# Patient Record
Sex: Female | Born: 1985 | Race: White | Hispanic: No | Marital: Married | Smoking: Current every day smoker
Health system: Southern US, Community
[De-identification: ages and names within clinical notes are randomized; demographics above are authoritative.]

---
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
## 2006-05-18 ENCOUNTER — Emergency Department: Payer: Self-pay | Admitting: Emergency Medicine

## 2006-07-31 ENCOUNTER — Other Ambulatory Visit: Payer: Self-pay

## 2006-07-31 ENCOUNTER — Emergency Department: Payer: Self-pay

## 2006-10-19 IMAGING — CR DG CHEST 1V PORT
1 series · 1 of 1 positions shown · non-contrast
Comparison: none

REASON FOR EXAM: Low O2 saturation
COMMENTS:  LMP: N/A

[view not recorded]
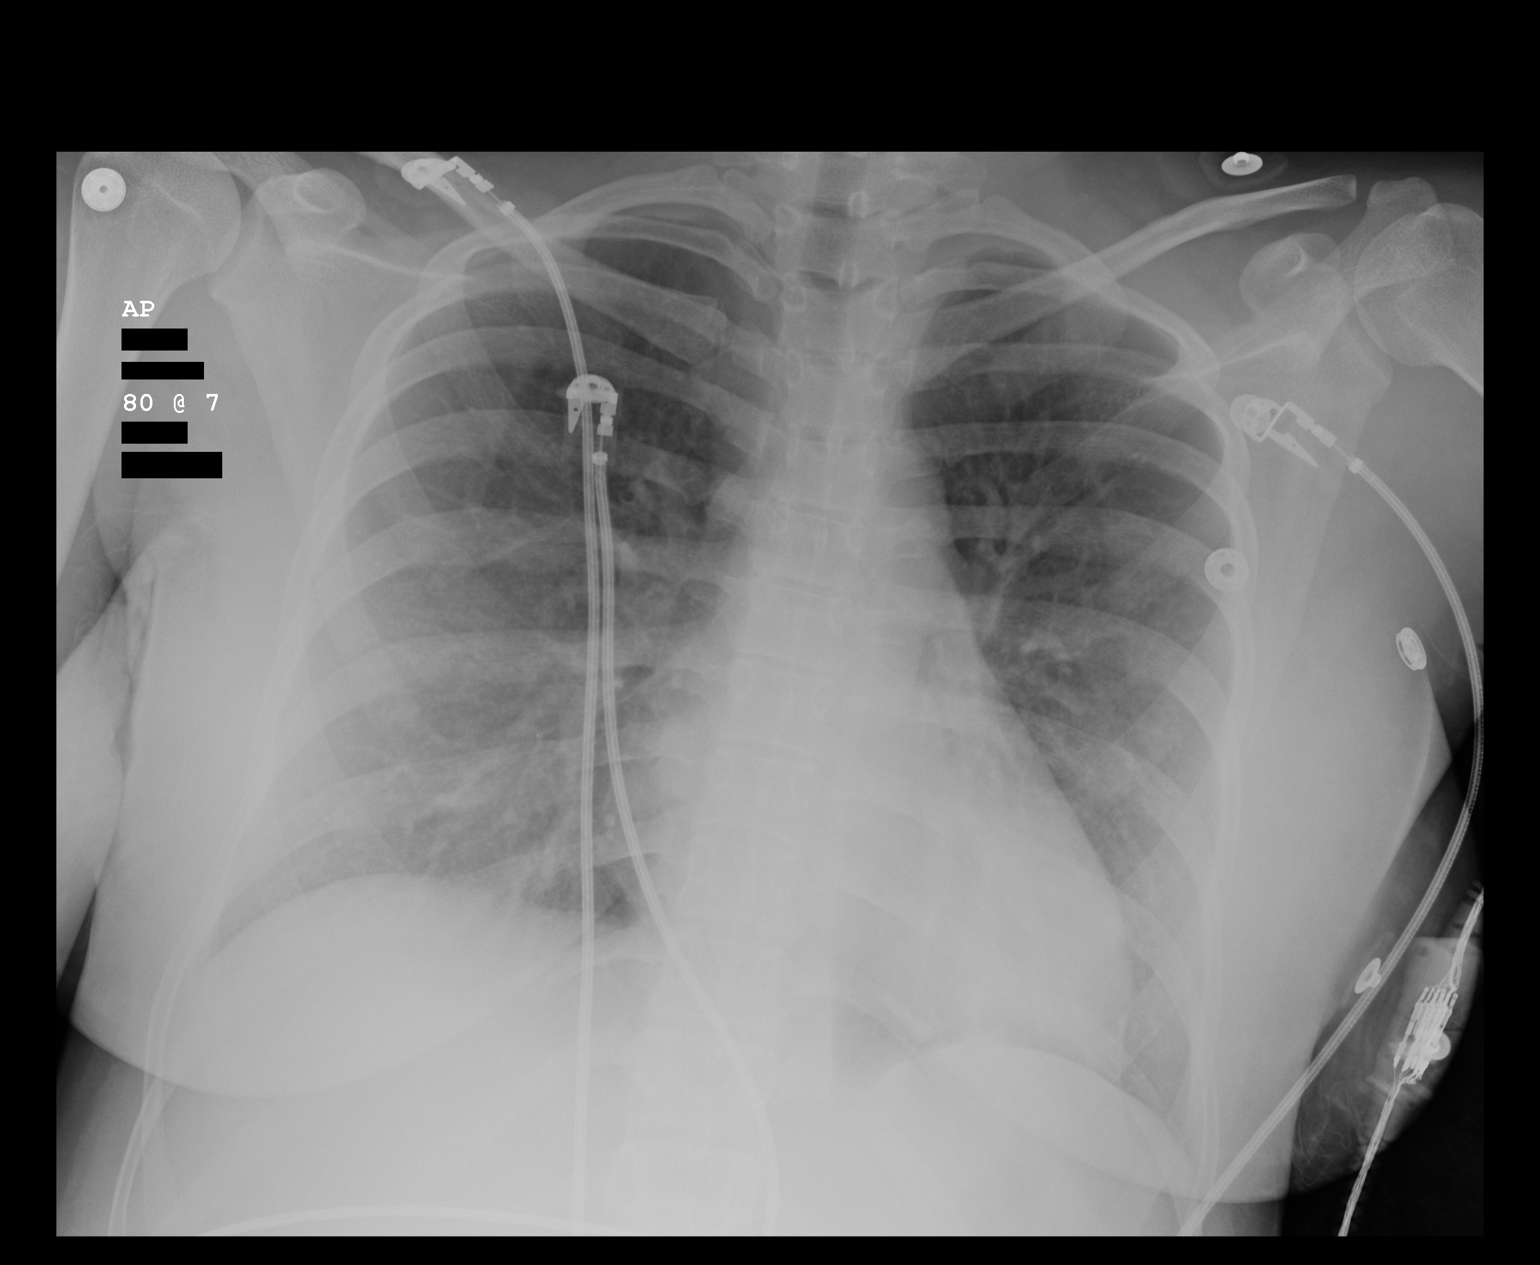

[1 of 1 positions shown; findings below may reference images not displayed]

PROCEDURE:     DXR - DXR PORTABLE CHEST SINGLE VIEW  - July 08, 2005  [DATE]

RESULT:     There are no prior studies available for comparison.

The exam demonstrates pulmonary vascular congestion which can be consistent
with the patient's gestational status. No overt pulmonary edema or
congestive failure is seen. No focal infiltrate or pneumothorax is evident.
The heart is not enlarged.
IMPRESSION: No significant consolidation, effusion or pneumothorax.
Slight pulmonary vascular congestion which is consistent with the patient's
gestational status.

## 2007-10-13 ENCOUNTER — Emergency Department: Payer: Self-pay | Admitting: Emergency Medicine

## 2007-10-27 ENCOUNTER — Emergency Department: Payer: Self-pay | Admitting: Emergency Medicine

## 2007-11-11 IMAGING — CR DG CHEST 2V
1 series · 2 of 2 positions shown · non-contrast
Comparison: none

REASON FOR EXAM: Chest pain        rm 19
COMMENTS:

PROCEDURE:     DXR - DXR CHEST PA (OR AP) AND LATERAL  - July 31, 2006 [DATE]
RESULT:        Comparison is made to prior study dated 05/18/06.
The lungs are clear.  The cardiac silhouette and visualized bony skeleton
are unremarkable.

[Series 1: view not recorded · 0.17mm/px · 2 of 2 slices shown]
[im 1/2]
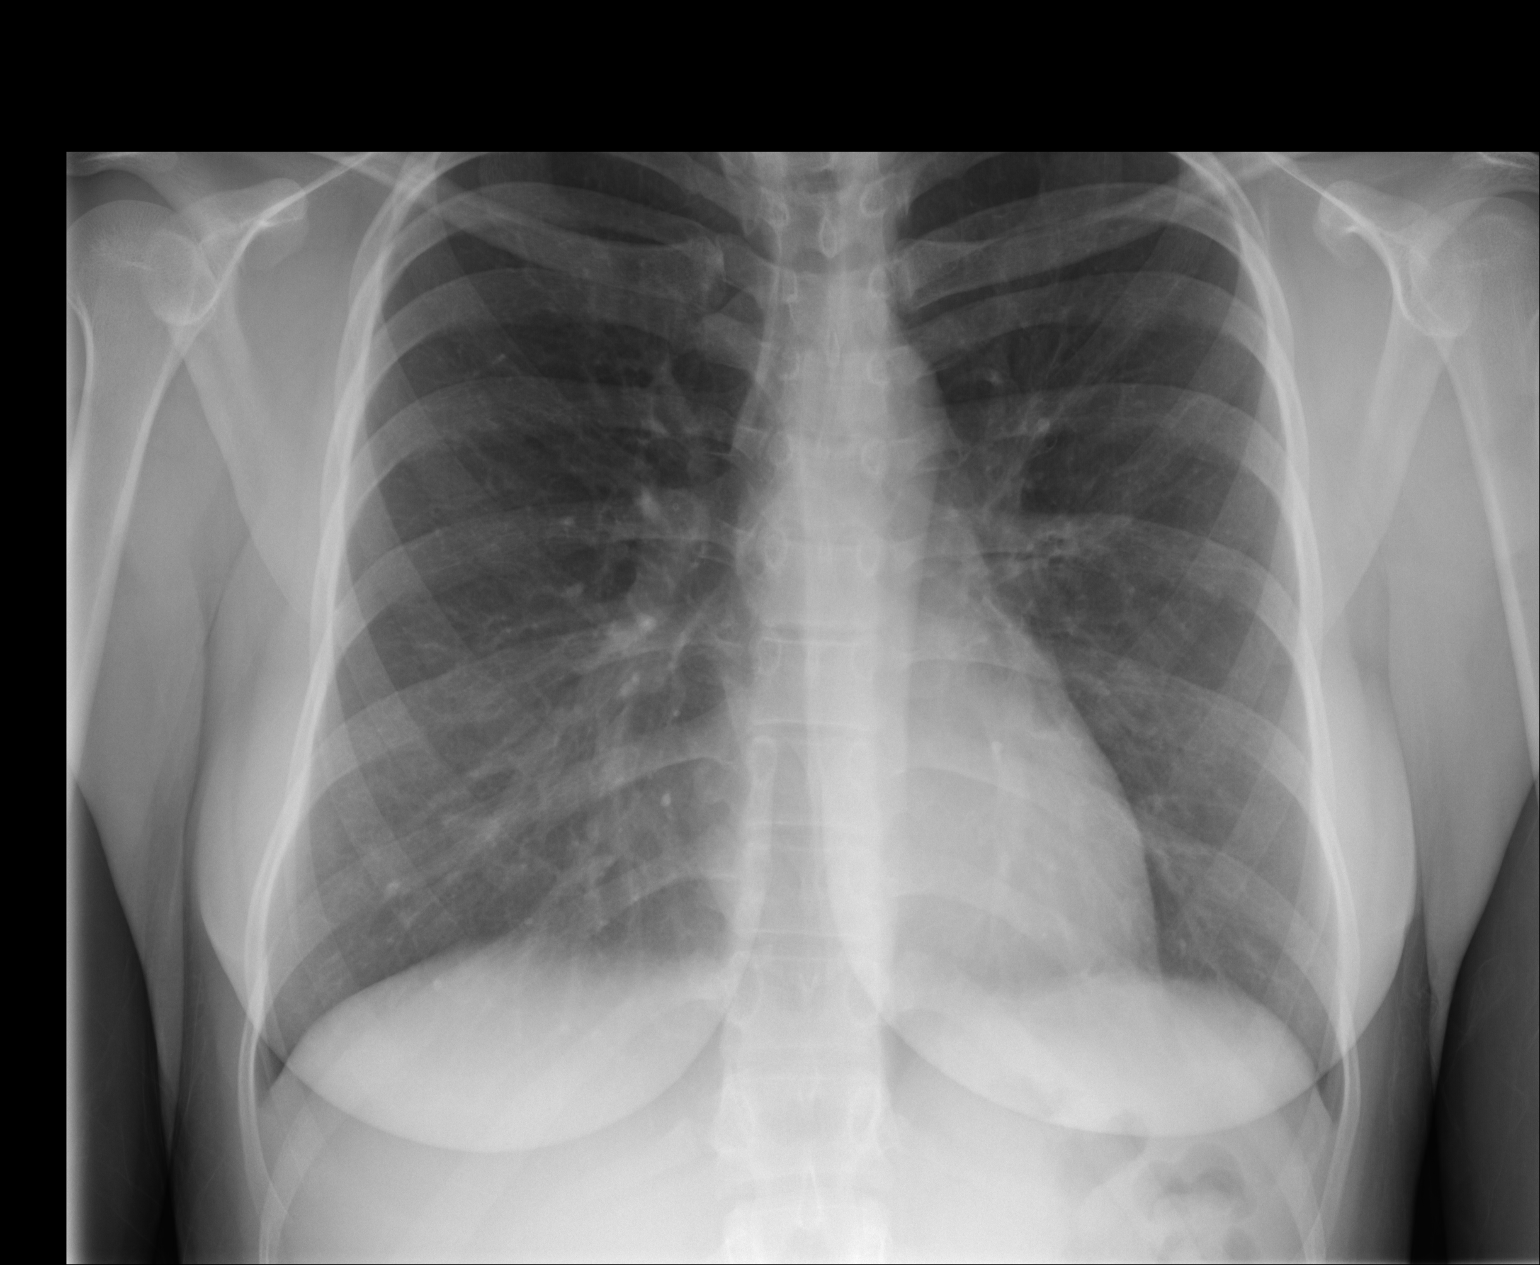
[im 2/2]
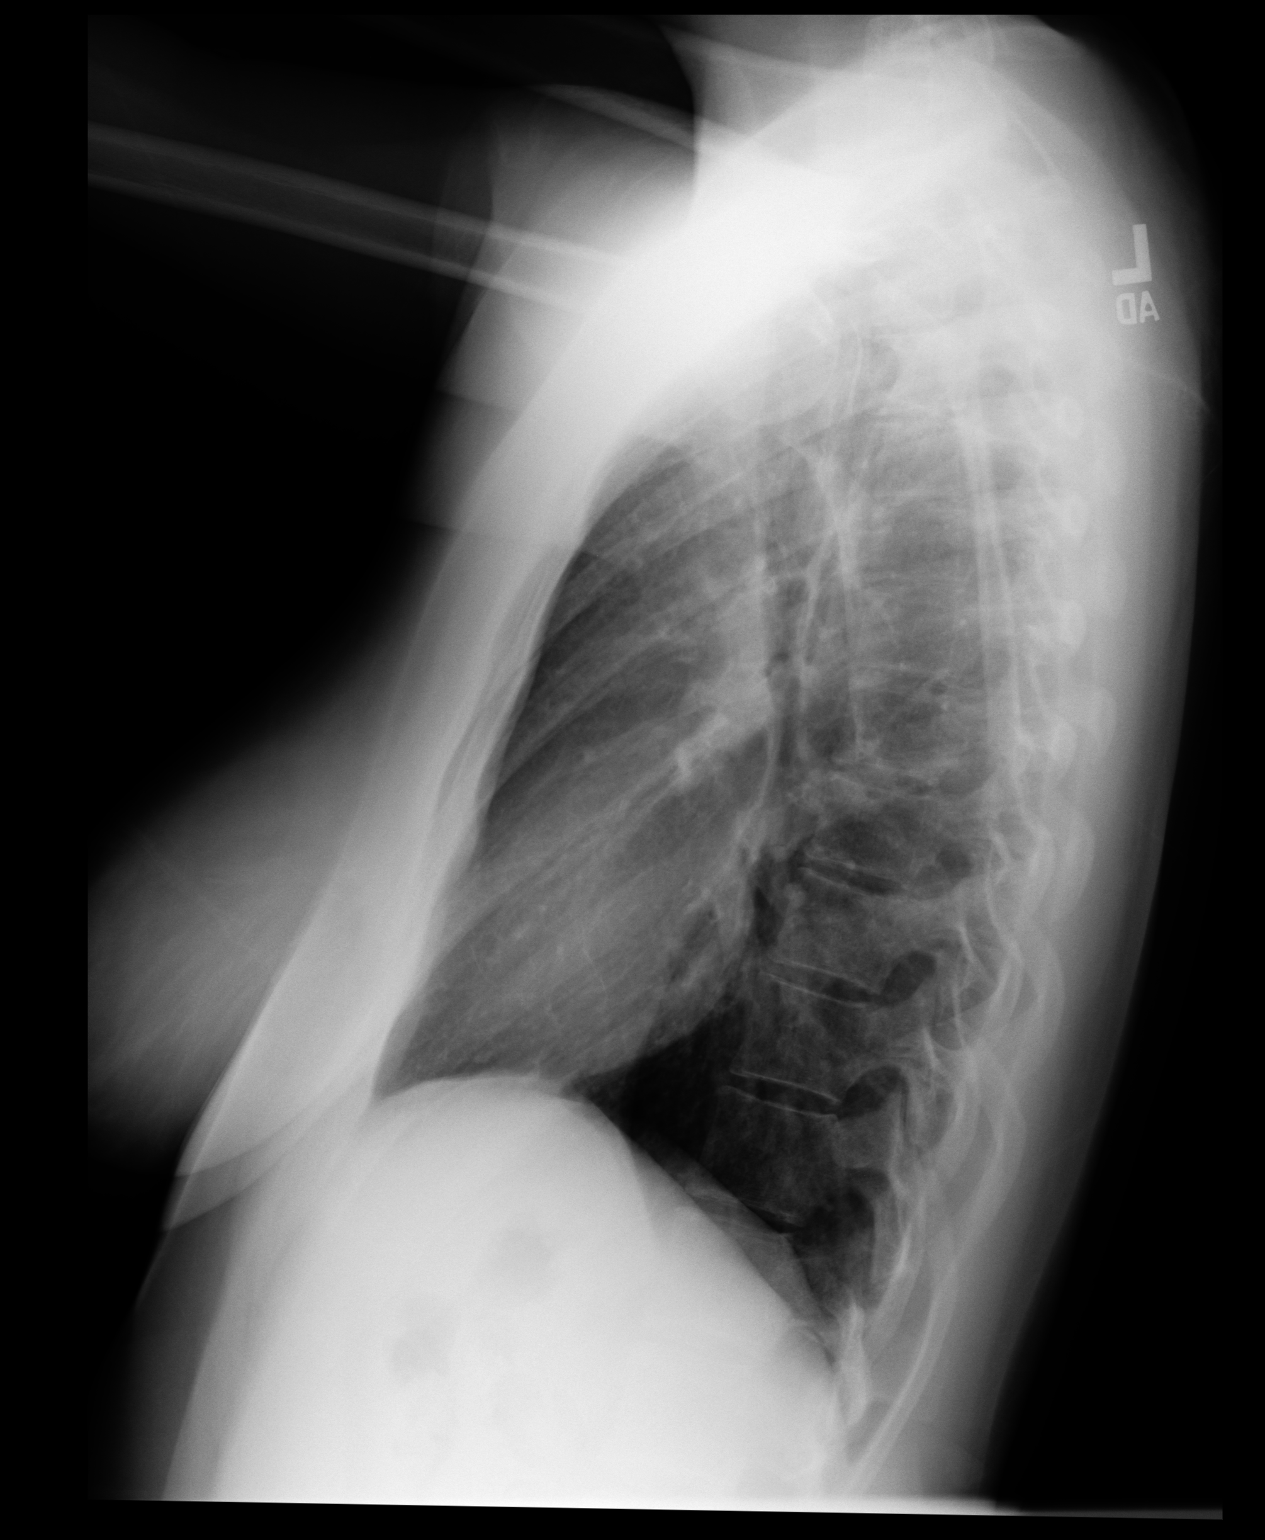

[2 of 2 positions shown; findings below may reference images not displayed]

IMPRESSION: Chest radiograph without evidence of acute cardiopulmonary
disease.

## 2008-03-17 ENCOUNTER — Other Ambulatory Visit: Payer: Self-pay

## 2008-03-17 ENCOUNTER — Emergency Department: Payer: Self-pay | Admitting: Emergency Medicine

## 2009-02-06 IMAGING — CR DG CHEST 2V
1 series · 2 of 2 positions shown · non-contrast
Comparison: none

REASON FOR EXAM: cough
COMMENTS:

PROCEDURE:     DXR - DXR CHEST PA (OR AP) AND LATERAL  - October 27, 2007  [DATE]
RESULT:     The lungs are mildly hyperinflated. There is no focal
infiltrate. The perihilar lung markings are minimally prominent but not
significantly changed from 31 July, 2006.

[Series 1: view not recorded · 0.17mm/px · 2 of 2 slices shown]
[im 1/2]
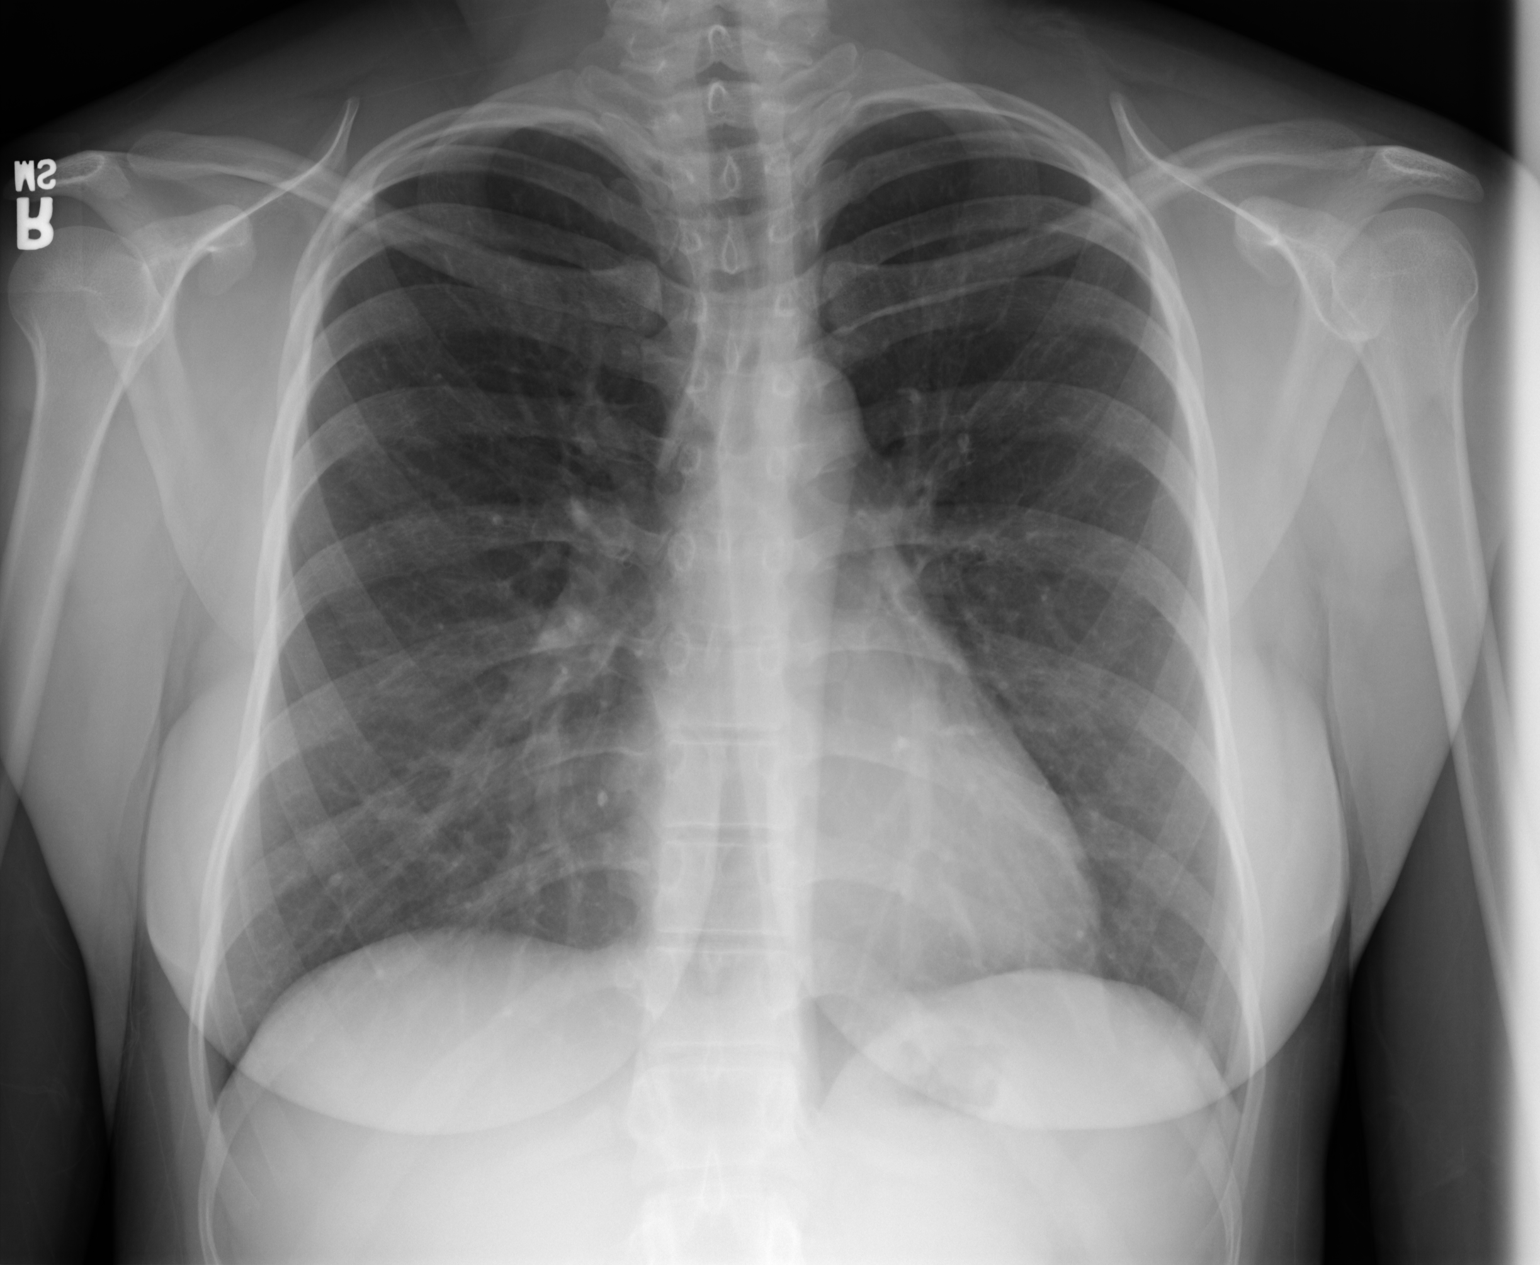
[im 2/2]
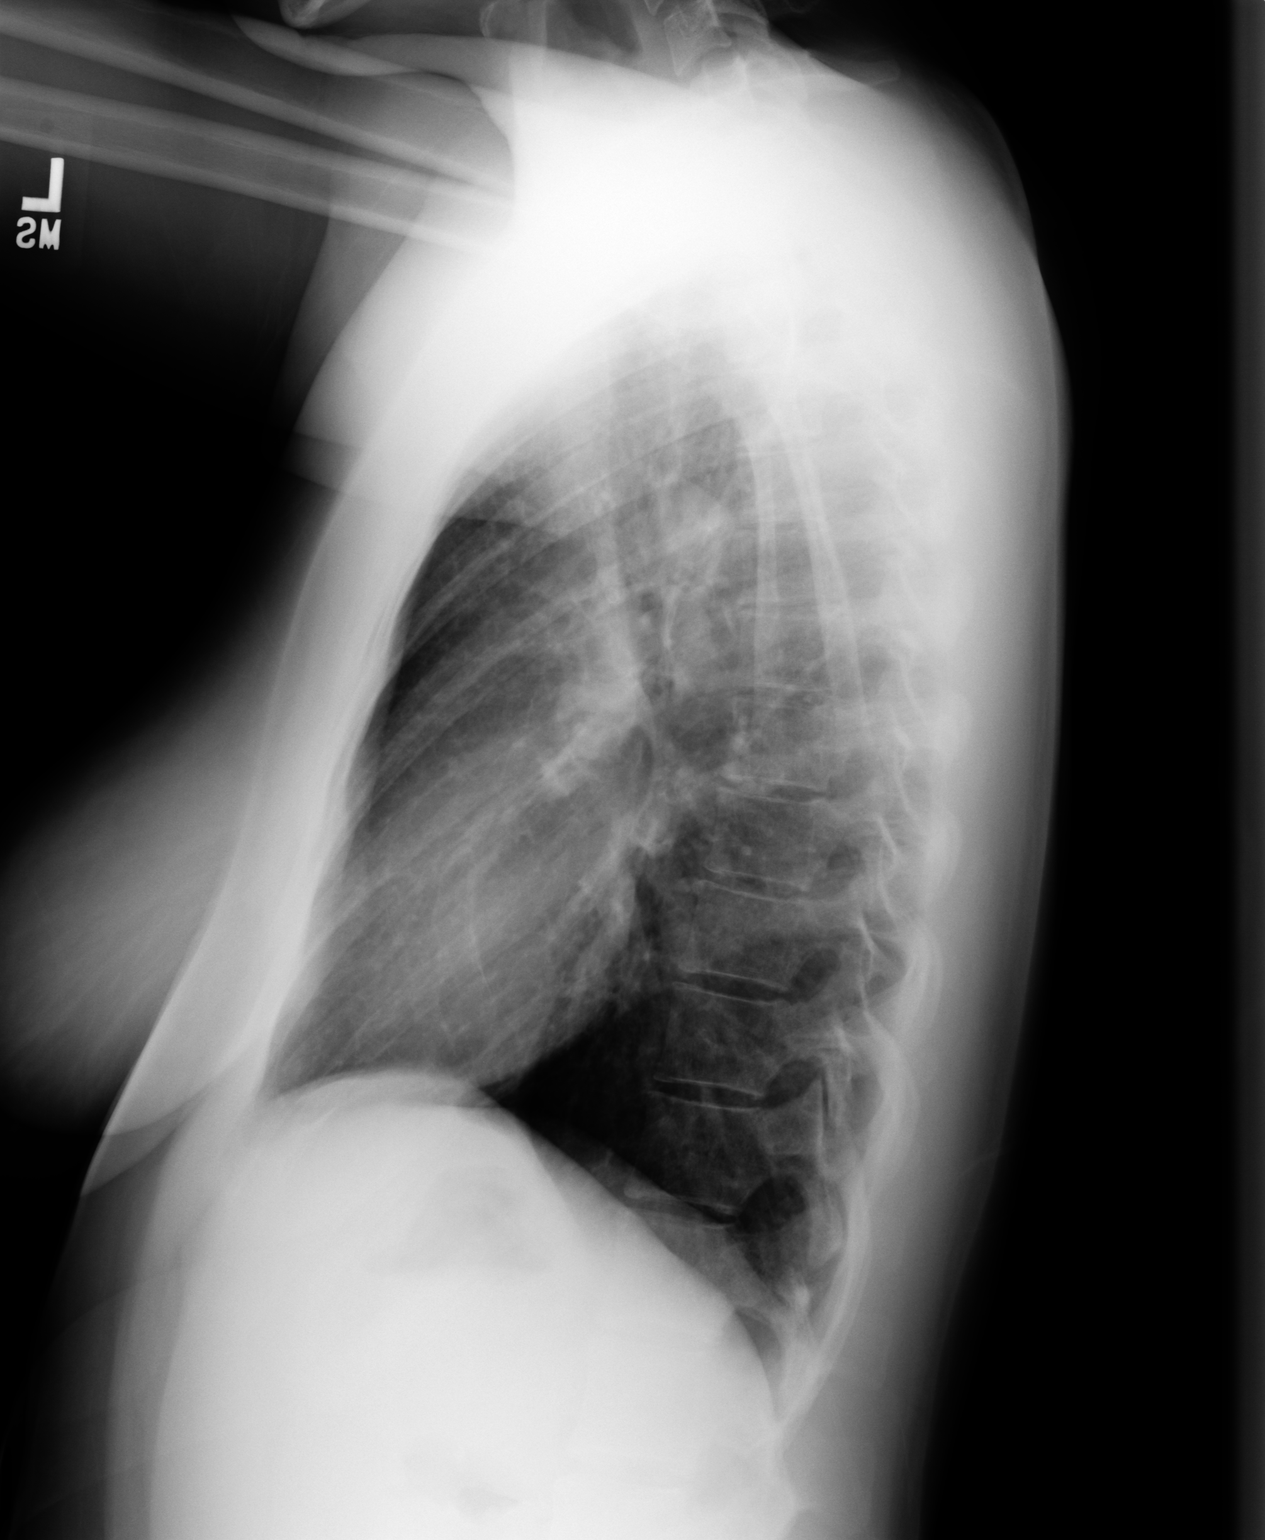

[2 of 2 positions shown; findings below may reference images not displayed]

IMPRESSION: There are findings which may reflect an element of reactive airway disease
and acute bronchitis. I do not see evidence of pneumonia.

## 2009-12-03 ENCOUNTER — Ambulatory Visit: Payer: Self-pay | Admitting: Gastroenterology

## 2011-03-08 ENCOUNTER — Inpatient Hospital Stay: Payer: Self-pay | Admitting: Internal Medicine

## 2011-03-12 ENCOUNTER — Encounter: Payer: Self-pay | Admitting: Cardiothoracic Surgery

## 2011-04-08 ENCOUNTER — Encounter: Payer: Self-pay | Admitting: Nurse Practitioner

## 2011-04-28 ENCOUNTER — Encounter: Payer: Self-pay | Admitting: Nurse Practitioner

## 2011-09-27 ENCOUNTER — Observation Stay: Payer: Self-pay | Admitting: Obstetrics and Gynecology

## 2011-10-23 ENCOUNTER — Observation Stay: Payer: Self-pay

## 2011-10-31 ENCOUNTER — Observation Stay: Payer: Self-pay | Admitting: Obstetrics & Gynecology

## 2011-11-08 ENCOUNTER — Observation Stay: Payer: Self-pay

## 2011-11-08 LAB — PIH PROFILE
BUN: 10 mg/dL (ref 7–18)
Calcium, Total: 9 mg/dL (ref 8.5–10.1)
Chloride: 107 mmol/L (ref 98–107)
Creatinine: 0.39 mg/dL — ABNORMAL LOW (ref 0.60–1.30)
EGFR (African American): >60
EGFR (Non-African Amer.): >60
Glucose: 84 mg/dL (ref 65–99)
HGB: 11.7 g/dL — ABNORMAL LOW (ref 12.0–16.0)
MCV: 94 fL (ref 80–100)
Osmolality: 279 (ref 275–301)
RBC: 3.77 10*6/uL — ABNORMAL LOW (ref 3.80–5.20)
RDW: 16.6 % — ABNORMAL HIGH (ref 11.5–14.5)
SGOT(AST): 13 U/L — ABNORMAL LOW (ref 15–37)
Sodium: 141 mmol/L (ref 136–145)
WBC: 10 10*3/uL (ref 3.6–11.0)

## 2011-11-08 LAB — URINALYSIS, COMPLETE
Blood: NEGATIVE
Leukocyte Esterase: NEGATIVE
Nitrite: NEGATIVE
Ph: 6 (ref 4.5–8.0)
Protein: NEGATIVE
RBC,UR: 2 /HPF (ref 0–5)
WBC UR: 1 /HPF (ref 0–5)

## 2011-11-17 ENCOUNTER — Ambulatory Visit: Payer: Self-pay | Admitting: Obstetrics & Gynecology

## 2011-11-17 LAB — CBC WITH DIFFERENTIAL/PLATELET
Basophil #: 0 10*3/uL (ref 0.0–0.1)
Eosinophil %: 5 %
HGB: 11.7 g/dL — ABNORMAL LOW (ref 12.0–16.0)
MCHC: 33.5 g/dL (ref 32.0–36.0)
MCV: 93 fL (ref 80–100)
Monocyte %: 5.1 %
Neutrophil %: 60.9 %
Platelet: 290 10*3/uL (ref 150–440)
RBC: 3.77 10*6/uL — ABNORMAL LOW (ref 3.80–5.20)
RDW: 16.7 % — ABNORMAL HIGH (ref 11.5–14.5)
WBC: 10.5 10*3/uL (ref 3.6–11.0)

## 2011-11-18 ENCOUNTER — Inpatient Hospital Stay: Payer: Self-pay

## 2011-11-19 LAB — PATHOLOGY REPORT

## 2011-11-19 LAB — HEMATOCRIT: HCT: 28.5 % — ABNORMAL LOW (ref 35.0–47.0)

## 2015-01-08 ENCOUNTER — Emergency Department: Payer: Self-pay | Admitting: Emergency Medicine

## 2015-02-23 NOTE — Op Note (Signed)
PATIENT NAME:  Mary FiremanJOSEY, Glennys M MR#:  [id number] DATE OF BIRTH:  Aug 03, 1986  DATE OF PROCEDURE:  11/18/2011  PREOPERATIVE DIAGNOSES: Term intrauterine pregnancy, prior history of cesarean section, and desire for permanent sterility.   POSTOPERATIVE DIAGNOSIS: Term intrauterine pregnancy, prior history of cesarean section, and desire for permanent sterility.   PROCEDURES PERFORMED: Low transverse cesarean section, bilateral tubal ligation.   SURGEON: Dierdre Searles. Paul Loni Abdon, MD  ASSISTANT: Kate SablePhilip Rosenow, MD  ANESTHESIA: Spinal.   ESTIMATED BLOOD LOSS: 210 mL.   COMPLICATIONS: None.   FINDINGS: Normal tubes, ovaries, and uterus. Viable female infant named Braley with 9 an d9 Apgars.   DISPOSITION: To the recovery room in stable condition.   TECHNIQUE: The patient is prepped and draped in the usual sterile fashion after adequate anesthesia is obtained in the supine position on the operating room table. Scalpel is used to create a low transverse skin incision down to the level of the rectus fascia. The rectus fascia is dissected bilaterally using Mayo scissors and then separated in the midline. The peritoneum is penetrated and the bladder is dissected and inferiorly retracted. A scalpel is used to create a low transverse hysterotomy incision that is then extended by blunt dissection. Amniotomy reveals clear fluid. The infant's head is grasped and delivered without the use of a suction device. The oropharynx is suctioned. The remaining portion of the infant is then delivered without complication.   Cord blood is obtained and the placenta is manually extracted. The uterus is externalized and cleansed of all membranes and debris using a moist sponge. Hysterotomy incision is closed with a running #1 Vicryl suture in a locking fashion followed by a second layer to imbricate the first layer with excellent hemostasis noted.   The right and left fallopian tubes are identified and grasped in the midportion with  a Babcock clamp and a loop is tied with two Vicryl sutures, excised, and cauterized. The uterus is then placed back in the intra-abdominal cavity and the paracolic gutters are irrigated with warm saline. Reexamination of all incisions reveals excellent hemostasis.   Seprafilm is placed over the lower uterine segment. The peritoneum is closed with an 0 Vicryl suture. Two catheters are placed for the On-Q pain pump through trocars into the subfascial space. The fascia is closed with 0 Maxon suture. Subcutaneous tissues are irrigated and hemostasis assured using electrocautery. The skin is closed with a 4-0 Vicryl suture in a subcuticular fashion followed by placement of Dermabond, which was also used to stabilize the catheters in place. The catheters are infused with 5 mL each of bupivacaine 0.5%. The catheters are secured in place with Steri-Strips and a Tegaderm. The patient goes to the Recovery Room in stable condition. All sponge, instrument, and needle counts are correct.  ____________________________ R. Annamarie MajorPaul Suprina Mandeville, MD rph:slb D: 11/18/2011 12:10:20 ET T: 11/18/2011 12:39:20 ET JOB#: [id number]  cc: Dierdre Searles. Paul Linken Mcglothen, MD, <Dictator> Nadara MustardOBERT P Kenyan Karnes MD ELECTRONICALLY SIGNED 11/18/2011 20:54
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
## 2016-01-19 ENCOUNTER — Encounter: Payer: Self-pay | Admitting: Emergency Medicine

## 2016-01-19 ENCOUNTER — Emergency Department: Payer: BLUE CROSS/BLUE SHIELD

## 2016-01-19 ENCOUNTER — Emergency Department
Admission: EM | Admit: 2016-01-19 | Discharge: 2016-01-19 | Disposition: A | Discharge status: Home / Self Care | Payer: BLUE CROSS/BLUE SHIELD | Attending: Emergency Medicine | Admitting: Emergency Medicine

## 2016-01-19 DIAGNOSIS — R1032 Left lower quadrant pain: Secondary | ICD-10-CM | POA: Diagnosis not present

## 2016-01-19 DIAGNOSIS — R1030 Lower abdominal pain, unspecified: Secondary | ICD-10-CM | POA: Diagnosis not present

## 2016-01-19 DIAGNOSIS — R079 Chest pain, unspecified: Secondary | ICD-10-CM | POA: Diagnosis not present

## 2016-01-19 DIAGNOSIS — Z3202 Encounter for pregnancy test, result negative: Secondary | ICD-10-CM | POA: Insufficient documentation

## 2016-01-19 DIAGNOSIS — N939 Abnormal uterine and vaginal bleeding, unspecified: Secondary | ICD-10-CM | POA: Insufficient documentation

## 2016-01-19 DIAGNOSIS — F17200 Nicotine dependence, unspecified, uncomplicated: Secondary | ICD-10-CM | POA: Diagnosis not present

## 2016-01-19 LAB — URINALYSIS COMPLETE WITH MICROSCOPIC (ARMC ONLY)
BACTERIA UA: NONE SEEN
Bilirubin Urine: NEGATIVE
Glucose, UA: NEGATIVE mg/dL
KETONES UR: NEGATIVE mg/dL
Nitrite: NEGATIVE
PH: 6 (ref 5.0–8.0)
PROTEIN: NEGATIVE mg/dL
SPECIFIC GRAVITY, URINE: 1.016 (ref 1.005–1.030)

## 2016-01-19 LAB — COMPREHENSIVE METABOLIC PANEL
ALBUMIN: 4.5 g/dL (ref 3.5–5.0)
ALK PHOS: 52 U/L (ref 38–126)
ALT: 19 U/L (ref 14–54)
AST: 20 U/L (ref 15–41)
Anion gap: 8 (ref 5–15)
BUN: 15 mg/dL (ref 6–20)
CALCIUM: 9.2 mg/dL (ref 8.9–10.3)
CO2: 21 mmol/L — AB (ref 22–32)
CREATININE: 0.46 mg/dL (ref 0.44–1.00)
Chloride: 107 mmol/L (ref 101–111)
GFR calc Af Amer: >60 mL/min (ref >60)
GFR calc non Af Amer: >60 mL/min (ref >60)
GLUCOSE: 98 mg/dL (ref 65–99)
Potassium: 3.8 mmol/L (ref 3.5–5.1)
SODIUM: 136 mmol/L (ref 135–145)
Total Bilirubin: 0.4 mg/dL (ref 0.3–1.2)
Total Protein: 7.7 g/dL (ref 6.5–8.1)

## 2016-01-19 LAB — CBC
HCT: 38.4 % (ref 35.0–47.0)
HEMOGLOBIN: 12.8 g/dL (ref 12.0–16.0)
MCH: 28.9 pg (ref 26.0–34.0)
MCHC: 33.4 g/dL (ref 32.0–36.0)
MCV: 86.5 fL (ref 80.0–100.0)
PLATELETS: 408 10*3/uL (ref 150–440)
RBC: 4.43 MIL/uL (ref 3.80–5.20)
RDW: 15.4 % — ABNORMAL HIGH (ref 11.5–14.5)
WBC: 12.1 10*3/uL — ABNORMAL HIGH (ref 3.6–11.0)

## 2016-01-19 LAB — WET PREP, GENITAL
Clue Cells Wet Prep HPF POC: NONE SEEN
SPERM: NONE SEEN
Trich, Wet Prep: NONE SEEN
Yeast Wet Prep HPF POC: NONE SEEN

## 2016-01-19 LAB — POCT PREGNANCY, URINE: Preg Test, Ur: NEGATIVE

## 2016-01-19 LAB — CHLAMYDIA/NGC RT PCR (ARMC ONLY)
CHLAMYDIA TR: NOT DETECTED
N GONORRHOEAE: NOT DETECTED

## 2016-01-19 LAB — LIPASE, BLOOD: Lipase: 14 U/L (ref 11–51)

## 2016-01-19 LAB — TROPONIN I

## 2016-01-19 MED ORDER — ACETAMINOPHEN 500 MG PO TABS: 1000.0000 mg | ORAL_TABLET | Freq: Once | ORAL | Status: DC

## 2016-01-19 NOTE — ED Notes (Signed)
Pt to ed with c/o abd pain that started this am, states "it feels like contractions in my abd and back"  Pt also reports right sided chest pain that started this am.  Pt reports LMP was 2 days ago.  States pain in chest is occasional and sharp at times.  Denies n/v/d,.

## 2016-01-19 NOTE — ED Provider Notes (Signed)
[facility] Emergency Department Provider Note  Time seen: 3:58 PM  I have reviewed the triage vital signs and the nursing notes.   HISTORY  Chief Complaint Chest Pain and Abdominal Pain    HPI Mary Mcbride is a 30 y.o. female with no past medical history who presents the emergency department with lower abdominal pain and vaginal bleeding. According to the patient she finished her menstrual cycle approximately 2 days ago however this morning she awoke with left lower abdominal pain and vaginal bleeding once again. States the abdominal pain feels like cramping in her lower abdomen more so on the left lower quadrant. Denies diarrhea. Denies nausea or vomiting. She did feel some radiation of pain to her right chest however that is gone now. Denies any trouble breathing. Denies any diaphoresis. Patient continues to experience lowers specially left lower quadrant abdominal pain which she describes as moderate dull aching. Denies any vaginal discharge.     History reviewed. No pertinent past medical history.  There are no active problems to display for this patient.   History reviewed. No pertinent past surgical history.  No current outpatient prescriptions on file.  Allergies Azithromycin  History reviewed. No pertinent family history.  Social History Social History  Substance Use Topics  . Smoking status: Current Every Day Smoker  . Smokeless tobacco: None  . Alcohol Use: Yes    Review of Systems Constitutional: Negative for fever. Cardiovascular: Negative for chest pain. Respiratory: Negative for shortness of breath. Gastrointestinal: Lower abdominal pain, left lower quadrant pain Genitourinary: Negative for dysuria. Positive for vaginal bleeding. Musculoskeletal: Negative for back pain. Neurological: Negative for headache 10-point ROS otherwise negative.  ____________________________________________   PHYSICAL EXAM:  VITAL SIGNS: ED  Triage Vitals  Enc Vitals Group     BP 01/19/16 1220 129/79 mmHg     Pulse Rate 01/19/16 1220 80     Resp 01/19/16 1220 20     Temp 01/19/16 1220 98.3 F (36.8 C)     Temp Source 01/19/16 1220 Oral     SpO2 01/19/16 1220 100 %     Weight 01/19/16 1220 210 lb (95.255 kg)     Height 01/19/16 1220 5\' 7"  (1.702 m)     Head Cir --      Peak Flow --      Pain Score 01/19/16 1220 4     Pain Loc --      Pain Edu? --      Excl. in GC? --     Constitutional: Alert and oriented. Well appearing and in no distress. Eyes: Normal exam ENT   Head: Normocephalic and atraumatic   Mouth/Throat: Mucous membranes are moist. Cardiovascular: Normal rate, regular rhythm. No murmur Respiratory: Normal respiratory effort without tachypnea nor retractions. Breath sounds are clear  Gastrointestinal: Soft, moderate lower abdominal pain especially in the left lower quadrant. No rebound or guarding. No distention. Musculoskeletal: Nontender with normal range of motion in all extremities.  Neurologic:  Normal speech and language. No gross focal neurologic deficits Skin:  Skin is warm, dry and intact.  Psychiatric: Mood and affect are normal. Speech and behavior are normal.  ____________________________________________    EKG  EKG reviewed and interpreted by myself shows normal sinus rhythm at 83 bpm. Narrow QRS, normal axis, normal intervals, nonspecific ST changes. No ST elevations.  ____________________________________________    RADIOLOGY  Chest x-ray within normal limits.   INITIAL IMPRESSION / ASSESSMENT AND PLAN / ED COURSE  Pertinent labs &  imaging results that were available during my care of the patient were reviewed by me and considered in my medical decision making (see chart for details).  Patient presents the emergency department with lower abdominal pains but she in the left lower quadrant. Patient also states vaginal bleeding, states her menstrual cycle ended 2 days ago but  started once again this morning. Describes the pain as radiating up both of her sides, and occasionally into the right chest but denies any radiation into the right chest currently. Patient's labs are largely within normal limits besides a mild leukocytosis. Urinalysis shows RBCs consistent with the patient's vaginal bleeding.  Pregnancy test is negative. We'll perform a pelvic exam, and obtain a pelvic ultrasound. Patient agreeable to plan. Suspect likely hemorrhagic cyst.  Pelvic exam shows minimal left adnexal tenderness. No cervical motion tenderness. No obvious discharge. Mild amount of vaginal bleeding.  Ultrasound is negative. Patient states her abdominal discomfort is much improved currently a 2 or 3/10. We will discharge patient home with primary care follow-up. Patient agreeable plan.  ____________________________________________   FINAL CLINICAL IMPRESSION(S) / ED DIAGNOSES  Lower abdominal pain   Minna Antis, MD 01/19/16 1744

## 2016-01-19 NOTE — Discharge Instructions (Signed)
You have been seen for abdominal pain. Your workup including labs and an ultrasound are normal. His take Tylenol or Motrin as needed for discomfort. Please follow-up with your primary care physician in 2 or 3 days for recheck. Return to the emergency department for any worsening pain, fever, or any other symptom personally concerning to your self.  Abdominal Pain, Adult Many things can cause abdominal pain. Usually, abdominal pain is not caused by a disease and will improve without treatment. It can often be observed and treated at home. Your health care provider will do a physical exam and possibly order blood tests and X-rays to help determine the seriousness of your pain. However, in many cases, more time must pass before a clear cause of the pain can be found. Before that point, your health care provider may not know if you need more testing or further treatment. HOME CARE INSTRUCTIONS Monitor your abdominal pain for any changes. The following actions may help to alleviate any discomfort you are experiencing:  Only take over-the-counter or prescription medicines as directed by your health care provider.  Do not take laxatives unless directed to do so by your health care provider.  Try a clear liquid diet (broth, tea, or water) as directed by your health care provider. Slowly move to a bland diet as tolerated. SEEK MEDICAL CARE IF:  You have unexplained abdominal pain.  You have abdominal pain associated with nausea or diarrhea.  You have pain when you urinate or have a bowel movement.  You experience abdominal pain that wakes you in the night.  You have abdominal pain that is worsened or improved by eating food.  You have abdominal pain that is worsened with eating fatty foods.  You have a fever. SEEK IMMEDIATE MEDICAL CARE IF:  Your pain does not go away within 2 hours.  You keep throwing up (vomiting).  Your pain is felt only in portions of the abdomen, such as the right side  or the left lower portion of the abdomen.  You pass bloody or black tarry stools. MAKE SURE YOU:  Understand these instructions.  Will watch your condition.  Will get help right away if you are not doing well or get worse.   This information is not intended to replace advice given to you by your health care provider. Make sure you discuss any questions you have with your health care provider.   Document Released: 07/28/2005 Document Revised: 07/09/2015 Document Reviewed: 06/27/2013 Elsevier Interactive Patient Education [employer]2016 [employer] Inc.

## 2016-12-31 ENCOUNTER — Encounter: Payer: Self-pay | Admitting: Emergency Medicine

## 2016-12-31 ENCOUNTER — Emergency Department
Admission: EM | Admit: 2016-12-31 | Discharge: 2016-12-31 | Disposition: A | Discharge status: Home / Self Care | Payer: BLUE CROSS/BLUE SHIELD | Attending: Emergency Medicine | Admitting: Emergency Medicine

## 2016-12-31 DIAGNOSIS — R21 Rash and other nonspecific skin eruption: Secondary | ICD-10-CM | POA: Diagnosis present

## 2016-12-31 DIAGNOSIS — F17200 Nicotine dependence, unspecified, uncomplicated: Secondary | ICD-10-CM | POA: Diagnosis not present

## 2016-12-31 MED ORDER — LIDOCAINE 5 % EX OINT: 1.0000 "application " | TOPICAL_OINTMENT | CUTANEOUS | 0 refills | Status: DC | PRN

## 2016-12-31 MED ORDER — PREDNISONE 10 MG (21) PO TBPK: ORAL_TABLET | ORAL | 0 refills | Status: DC
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
## 2016-12-31 NOTE — ED Triage Notes (Signed)
Presents with rash to chest and under left arm  States this area is very tender to touch  Esp under left arm  Also having some discomfort in chest with inspiration

## 2016-12-31 NOTE — Discharge Instructions (Signed)
Follow-up with the primary care provider if not improving over the week. Return to the emergency department for symptoms that change or worsen if you're unable schedule an appointment with her primary care provider.

## 2017-05-23 ENCOUNTER — Encounter: Payer: Self-pay | Admitting: Emergency Medicine

## 2017-05-23 ENCOUNTER — Emergency Department
Admission: EM | Admit: 2017-05-23 | Discharge: 2017-05-23 | Disposition: A | Discharge status: Home / Self Care | Payer: BLUE CROSS/BLUE SHIELD | Attending: Emergency Medicine | Admitting: Emergency Medicine

## 2017-05-23 ENCOUNTER — Other Ambulatory Visit: Payer: Self-pay

## 2017-05-23 DIAGNOSIS — G43809 Other migraine, not intractable, without status migrainosus: Secondary | ICD-10-CM | POA: Diagnosis not present

## 2017-05-23 DIAGNOSIS — F17200 Nicotine dependence, unspecified, uncomplicated: Secondary | ICD-10-CM | POA: Diagnosis not present

## 2017-05-23 DIAGNOSIS — R51 Headache: Secondary | ICD-10-CM | POA: Diagnosis present

## 2017-05-23 MED ORDER — KETOROLAC TROMETHAMINE 10 MG PO TABS: 10.0000 mg | ORAL_TABLET | Freq: Three times a day (TID) | ORAL | 0 refills | Status: DC | PRN

## 2017-05-23 MED ORDER — PROCHLORPERAZINE MALEATE 10 MG PO TABS
10.0000 mg | ORAL_TABLET | Freq: Once | ORAL | Status: AC
  Administered 2017-05-23: 10 mg via ORAL
  Filled 2017-05-23: qty 1

## 2017-05-23 MED ORDER — ACETAMINOPHEN 500 MG PO TABS
1000.0000 mg | ORAL_TABLET | Freq: Once | ORAL | Status: AC
  Administered 2017-05-23: 1000 mg via ORAL
  Filled 2017-05-23: qty 2

## 2017-05-23 MED ORDER — PROCHLORPERAZINE MALEATE 10 MG PO TABS: 10.0000 mg | ORAL_TABLET | Freq: Four times a day (QID) | ORAL | 0 refills | Status: DC | PRN

## 2017-05-23 MED ORDER — KETOROLAC TROMETHAMINE 60 MG/2ML IM SOLN
60.0000 mg | Freq: Once | INTRAMUSCULAR | Status: AC
  Administered 2017-05-23: 60 mg via INTRAMUSCULAR
  Filled 2017-05-23: qty 2

## 2017-05-23 NOTE — ED Provider Notes (Signed)
[facility] Emergency Department Provider Note  ____________________________________________  Time seen: Approximately 8:33 AM  I have reviewed the triage vital signs and the nursing notes.   HISTORY  Chief Complaint Migraine    HPI Mary Mcbride is a 31 y.o. female with a history of chronic migraines presenting with migraine. The patient reports that she was at work on Friday evening when she developed a throbbing pain behind the right eye, right side of her head and right neck typical of her prior migraines.  The pain has been constant and but not worse; not worst HA of life. This has been associated with some lightheaded sensation and mild photophobia. She has tried some ibuprofen, which does improve her pain but then the headache comes back. She denies phonophobia, nausea or vomiting, fever, trauma, or recent tick bites. No fevers or chills, neck stiffness.   History reviewed. No pertinent past medical history.  There are no active problems to display for this patient.   History reviewed. No pertinent surgical history.  Current Outpatient Rx  . Order #: [id number] Class: Print  . Order #: [id number] Class: Print  . Order #: [id number] Class: Print  . Order #: [id number] Class: Print    Allergies Azithromycin  History reviewed. No pertinent family history.  Social History Social History  Substance Use Topics  . Smoking status: Current Every Day Smoker  . Smokeless tobacco: Never Used  . Alcohol use Yes    Review of Systems Constitutional: No fever/chills.Positive lightheadedness. No syncope. Eyes: Mild photophobia without blurred or double vision. ENT: No sore throat. No congestion or rhinorrhea. Cardiovascular: Denies chest pain. Denies palpitations. Respiratory: Denies shortness of breath.  No cough. Gastrointestinal: No abdominal pain.  No nausea, no vomiting.  No diarrhea.  No constipation. Genitourinary: Negative for  dysuria. Musculoskeletal: Negative for back pain. Skin: Negative for rash. Neurological: Positive for headaches. No focal numbness, tingling or weakness. No changes in speech or vision. No changes in mental status. No difficulty walking    ____________________________________________   PHYSICAL EXAM:  VITAL SIGNS: ED Triage Vitals  Enc Vitals Group     BP 05/23/17 0807 138/84     Pulse Rate 05/23/17 0807 90     Resp 05/23/17 0807 15     Temp 05/23/17 0807 98.7 F (37.1 C)     Temp Source 05/23/17 0807 Oral     SpO2 05/23/17 0807 100 %     Weight 05/23/17 0808 201 lb (91.2 kg)     Height 05/23/17 0808 5\' 7"  (1.702 m)     Head Circumference --      Peak Flow --      Pain Score 05/23/17 0811 7     Pain Loc --      Pain Edu? --      Excl. in GC? --     Constitutional: Alert and oriented. Well appearing and in no acute distress; eating rice crispy treats on my examination. Answers questions appropriately. Eyes: Conjunctivae are normal.  EOMI. PERRLA. No further [place] frontal nystagmus. No scleral icterus. Head: Atraumatic. No raccoon eyes or Battle sign. Nose: No congestion/rhinnorhea. Mouth/Throat: Mucous membranes are moist. No posterior pharyngeal erythema, tonsillar swelling or exudate. Posterior palate is symmetric and uvula is midline. EARS: TMs are clear without bulge, erythema or swelling bilaterally. Canals are clear as well. Neck: No stridor.  Supple.  No JVD. No meningismus. Range of motion without pain or stiffness. Cardiovascular: Normal rate, regular rhythm. No murmurs, rubs or gallops.  Respiratory:  Normal respiratory effort.  No accessory muscle use or retractions. Lungs CTAB.  No wheezes, rales or ronchi. Gastrointestinal: Soft, nontender and nondistended.  No guarding or rebound.  No peritoneal signs. Musculoskeletal: No LE edema. Neurologic:  A&Ox3.  Speech is clear.  Face and smile are symmetric.  EOMI.  PERRLA. No horizontal or vertical nystagmus. Moves all  extremities well. Normal gait without ataxia. Skin:  Skin is warm, dry and intact. No rash noted. Psychiatric: Mood and affect are normal. Speech and behavior are normal.  Normal judgement  ____________________________________________   LABS (all labs ordered are listed, but only abnormal results are displayed)  Labs Reviewed - No data to display ____________________________________________  EKG  ED ECG REPORT I, Rockne MenghiniNorman, Anne-Caroline, the attending physician, personally viewed and interpreted this ECG.   Date: 05/23/2017  EKG Time: 817  Rate: 80  Rhythm: normal sinus rhythm  Axis: leftward  Intervals:none  ST&T Change: Nonspecific T-wave inversions in V1 to V3. No ST elevation.  ____________________________________________  RADIOLOGY  No results found.  ____________________________________________   PROCEDURES  Procedure(s) performed: None  Procedures  Critical Care performed: No ____________________________________________   INITIAL IMPRESSION / ASSESSMENT AND PLAN / ED COURSE  Pertinent labs & imaging results that were available during my care of the patient were reviewed by me and considered in my medical decision making (see chart for details).  31 y.o. female with a history of migraines presenting with typical migraine, no focal neurologic abnormalities on my examination. Overall, the patient is hemodynamically stable and afebrile. There is no evidence of an acute neurologic deficit, CVA, subarachnoid hemorrhage are very unlikely. There is no clinical history or physical exam finding that is consistent with an infectious process. I will treat the patient symptomatically, and anticipate discharge home. No imaging is indicated at this time. Return precautions as well as follow-up instructions have been discussed.  ____________________________________________  FINAL CLINICAL IMPRESSION(S) / ED DIAGNOSES  Final diagnoses:  Other migraine without status  migrainosus, not intractable         NEW MEDICATIONS STARTED DURING THIS VISIT:  New Prescriptions   KETOROLAC (TORADOL) 10 MG TABLET    Take 1 tablet (10 mg total) by mouth every 8 (eight) hours as needed for moderate pain (with food).   PROCHLORPERAZINE (COMPAZINE) 10 MG TABLET    Take 1 tablet (10 mg total) by mouth every 6 (six) hours as needed (headache with nausea).      Rockne MenghiniNorman, Anne-Caroline, MD 05/23/17 [phone]

## 2017-05-23 NOTE — ED Triage Notes (Signed)
Pt c/o migraine that started Saturday. Has had photophobia. Eating rice krispie treat in triage. NAD. Ambulatory to triage with steady gait.  Pt also c/o multiple dizzy spells and felt like was going to pass out last Wednesday which was before migraine started.

## 2017-05-23 NOTE — Discharge Instructions (Signed)
Please drink plenty of fluid to stay well hydrated, eat small regular meals throughout the day, and get plenty of rest to avoid migraines.  You may take Toradol for your headache, but do not take any other NSAID medications including Advil, Motrin, ibuprofen or Aleve when you're taking Toradol as this may irritate the lining of her stomach.  Return to the emergency department if you develop severe pain, nausea or vomiting, fever, numbness tingling or weakness, or any other symptoms concerning to you.

## 2018-02-03 IMAGING — US US PELVIS COMPLETE
1 series · 14 of 25 positions shown · non-contrast
Comparison: None

CLINICAL DATA: LEFT lower quadrant pain for a hours. Prior
C-section

EXAM:
TRANSABDOMINAL AND TRANSVAGINAL ULTRASOUND OF PELVIS
TECHNIQUE: Both transabdominal and transvaginal ultrasound examinations of the
pelvis were performed. Transabdominal technique was performed for
global imaging of the pelvis including uterus, ovaries, adnexal
regions, and pelvic cul-de-sac. It was necessary to proceed with
endovaginal exam following the transabdominal exam to visualize the
endometrium and ovaries.

[Series 1: us pelvis complete · 0.22mm/px · 14 of 107 slices shown]
[im 1/107]
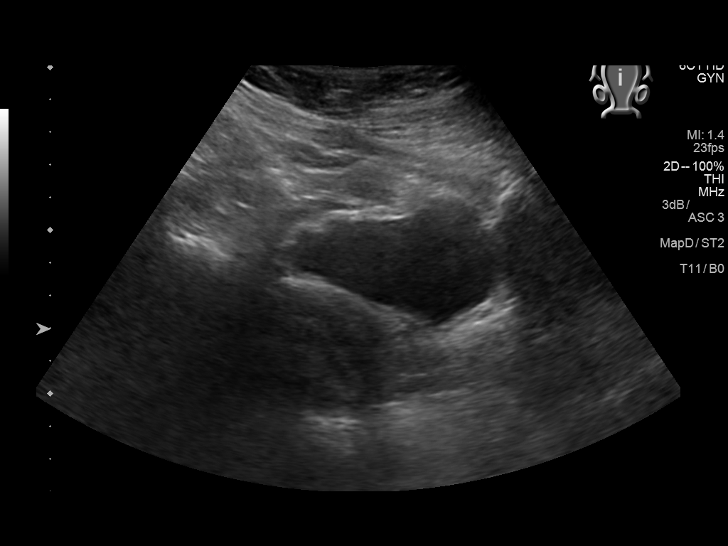
[im 9/107]
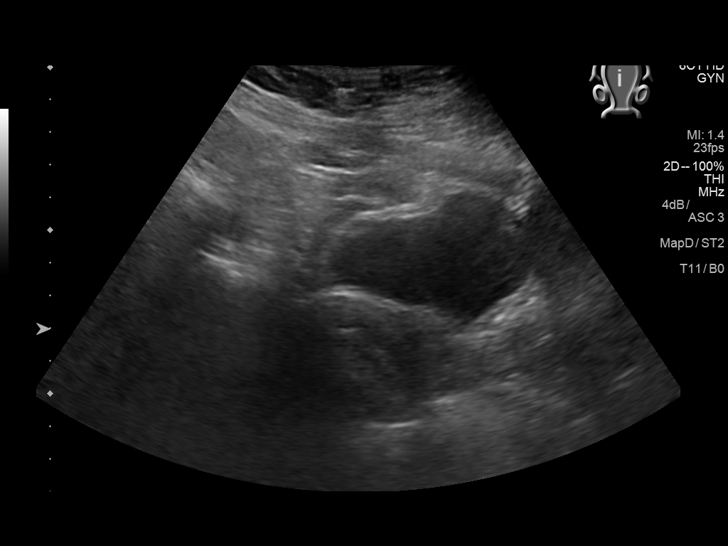
[im 18/107]
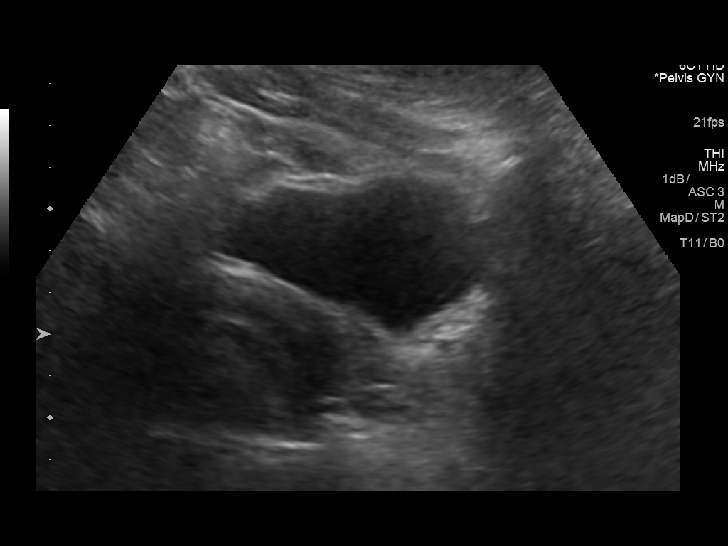
[im 27/107]
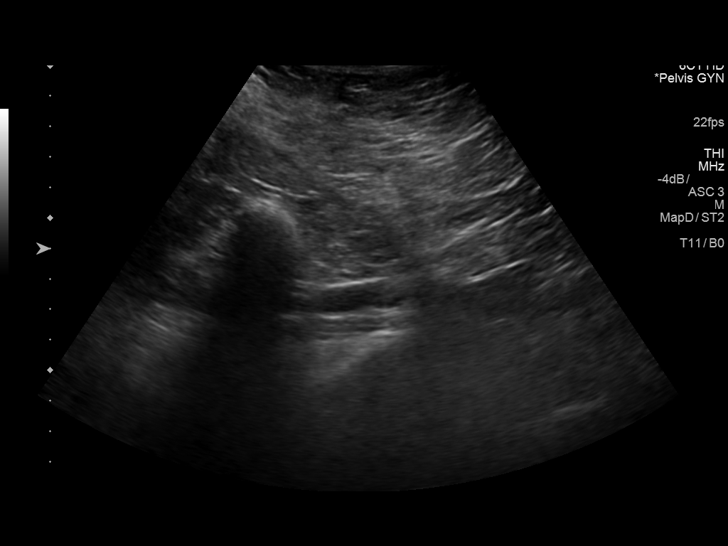
[im 36/107]
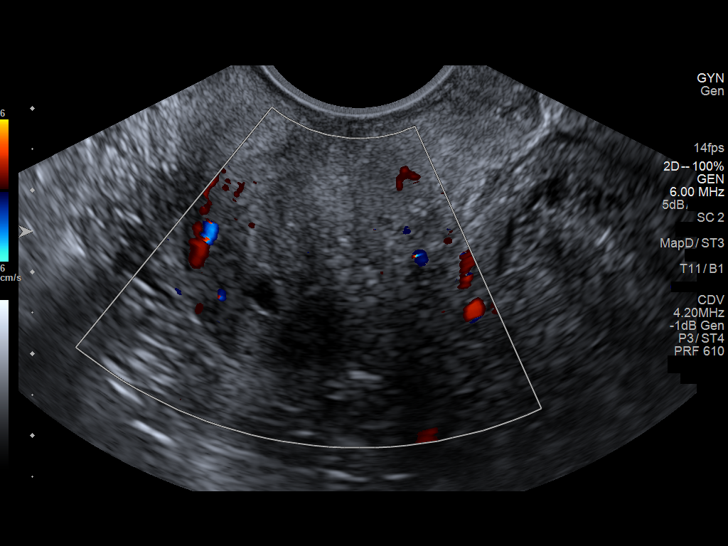
[im 40/107]
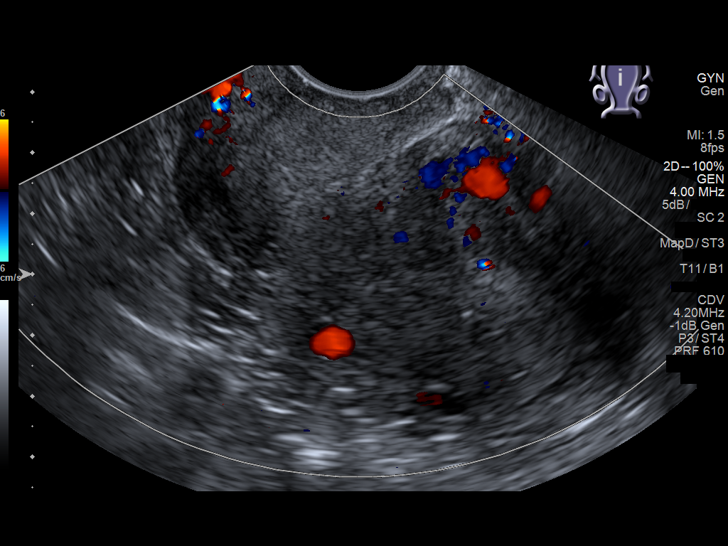
[im 49/107]
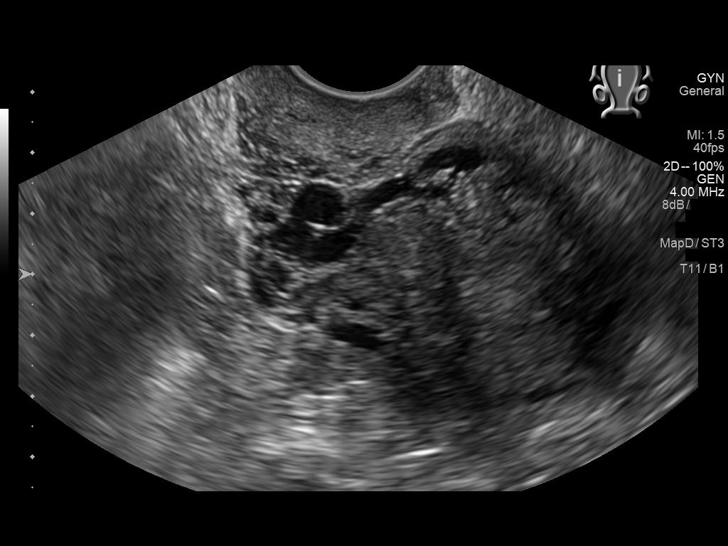
[im 58/107]
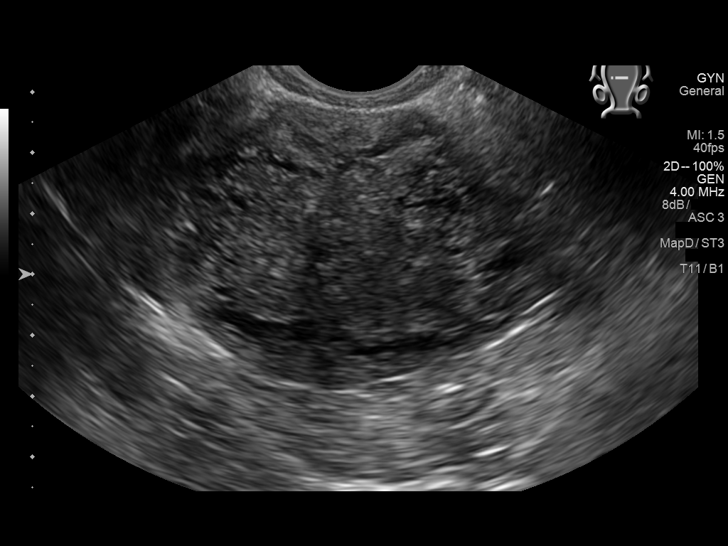
[im 67/107]
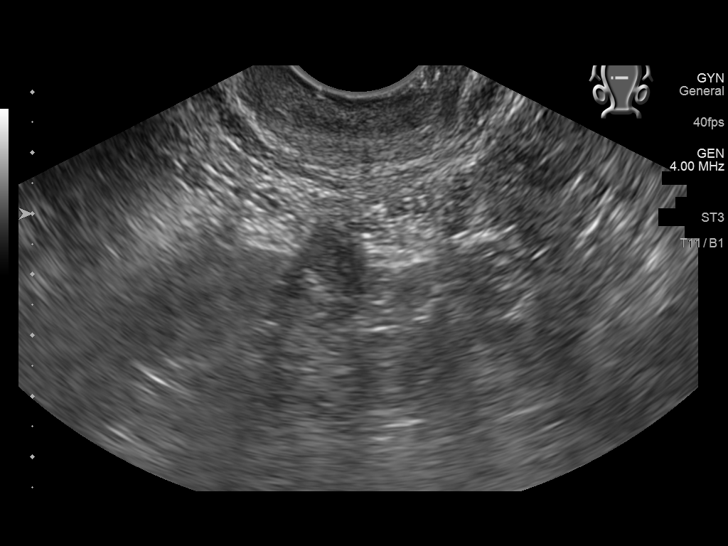
[im 71/107]
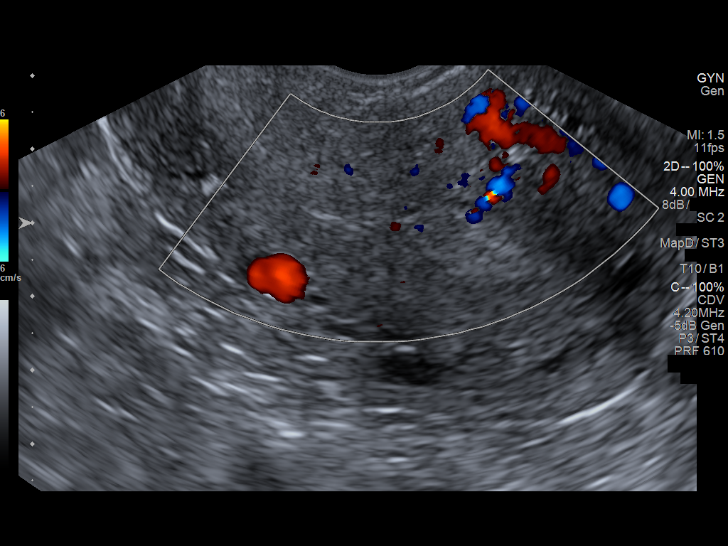
[im 80/107]
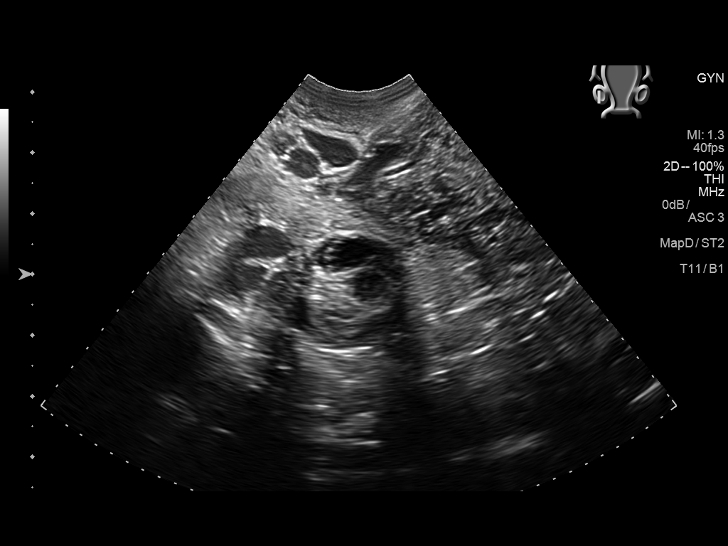
[im 89/107]
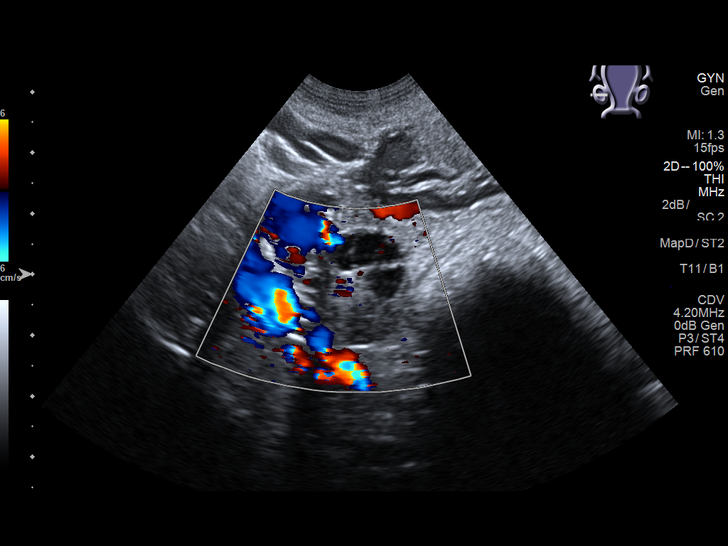
[im 98/107]
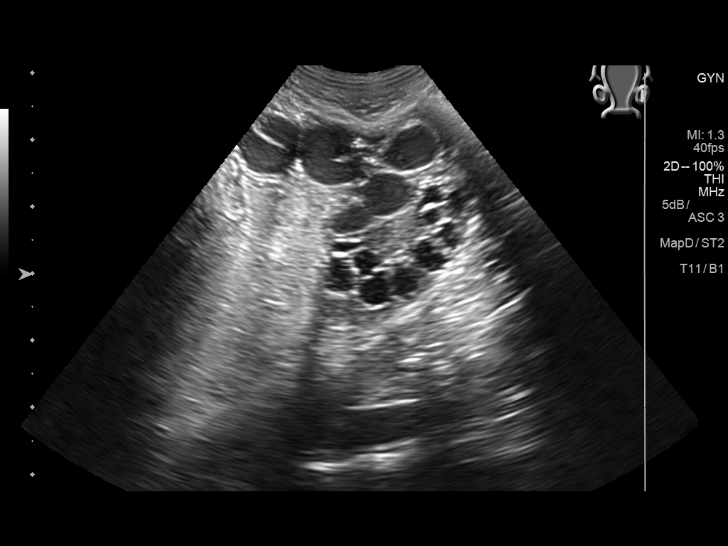
[im 107/107]
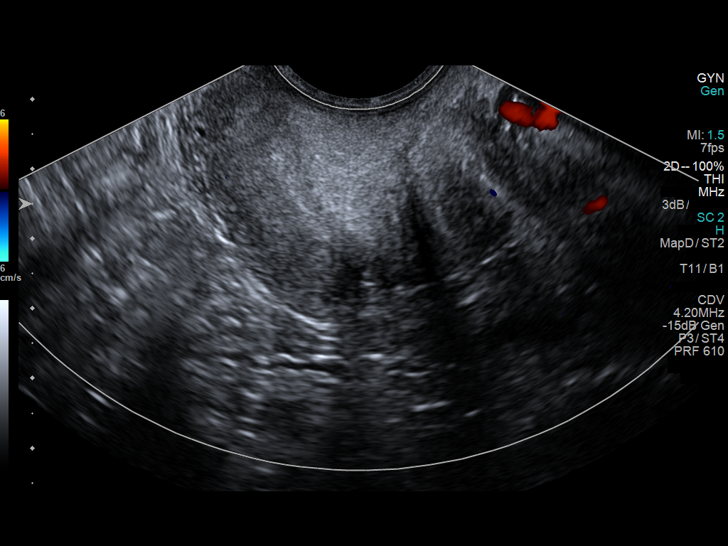

[14 of 25 positions shown; findings below may reference images not displayed]

FINDINGS: Uterus

Measurements: Normal in size measuring 8.3 x 5.2 x 6.4 cm.. No
fibroids or other mass visualized.

Endometrium

Thickness: Normal in thickness for premenopausal female at 9 mm.. No
focal abnormality visualized.

Right ovary

Measurements: Normal in size with small follicles measuring 2.0 x
1.6 x 1.6 cm..

Left ovary

Measurements: Normal in size with small follicles measuring 3.0 x
1.7 x 1.6 cm.

Other findings

No abnormal free fluid.
IMPRESSION: Normal pelvic ultrasound.

## 2019-03-30 ENCOUNTER — Ambulatory Visit: Payer: Self-pay | Admitting: Obstetrics & Gynecology

## 2019-05-15 ENCOUNTER — Ambulatory Visit: Payer: Self-pay | Admitting: Obstetrics and Gynecology

## 2019-05-15 ENCOUNTER — Other Ambulatory Visit: Payer: Self-pay

## 2019-05-15 ENCOUNTER — Encounter: Payer: Self-pay | Admitting: Obstetrics and Gynecology

## 2019-05-15 VITALS — BP 95/60 | HR 94 | Ht 66.0 in | Wt 205.2 lb

## 2019-05-15 DIAGNOSIS — L900 Lichen sclerosus et atrophicus: Secondary | ICD-10-CM

## 2019-05-15 DIAGNOSIS — K602 Anal fissure, unspecified: Secondary | ICD-10-CM

## 2019-05-15 DIAGNOSIS — B373 Candidiasis of vulva and vagina: Secondary | ICD-10-CM

## 2019-05-15 DIAGNOSIS — B3731 Acute candidiasis of vulva and vagina: Secondary | ICD-10-CM

## 2019-05-15 MED ORDER — CLOBETASOL PROPIONATE 0.05 % EX CREA: 1.0000 "application " | TOPICAL_CREAM | Freq: Two times a day (BID) | CUTANEOUS | 2 refills | Status: AC

## 2019-05-15 MED ORDER — FLUCONAZOLE 150 MG PO TABS: ORAL_TABLET | ORAL | 1 refills | Status: AC

## 2019-05-15 NOTE — Progress Notes (Signed)
HPI:      [name] is a 33 y.o. G2P0 who LMP was Patient's last menstrual period was 04/30/2019.  Subjective:   She presents today complaining of severe vulvar itching.  She says it has been present for months.  She also complains of very painful bowel movements and it feels like something is tearing each time.  She states that she has struggled her whole life with constipation and has bowel movements approximately twice per week. She has used over-the-counter antifungals without success She says she has very heavy menstrual bleeding in the first 3 days of her cycle are severe.  She says it has been this way ever since her tubal ligation. She also complains of a decreased sex drive since before the birth of her twins.  She feels as if it may be a great issue because she is self-conscious about her current size.  She states that she used to weigh 115.    Hx: The following portions of the patient's history were reviewed and updated as appropriate:             She  has no past medical history on file. She does not have a problem list on file. She  has no past surgical history on file. Her family history is not on file. She  reports that she has been smoking. She has never used smokeless tobacco. She reports current alcohol use. She reports that she does not use drugs. She has a current medication list which includes the following prescription(s): acetaminophen and ibuprofen. She is allergic to azithromycin.       Review of Systems:  Review of Systems  Constitutional: Denied constitutional symptoms, night sweats, recent illness, fatigue, fever, insomnia and weight loss.  Eyes: Denied eye symptoms, eye pain, photophobia, vision change and visual disturbance.  Ears/Nose/Throat/Neck: Denied ear, nose, throat or neck symptoms, hearing loss, nasal discharge, sinus congestion and sore throat.  Cardiovascular: Denied cardiovascular symptoms, arrhythmia, chest pain/pressure, edema, exercise  intolerance, orthopnea and palpitations.  Respiratory: Denied pulmonary symptoms, asthma, pleuritic pain, productive sputum, cough, dyspnea and wheezing.  Gastrointestinal: Denied, gastro-esophageal reflux, melena, nausea and vomiting.  Genitourinary: See HPI for additional information.  Musculoskeletal: Denied musculoskeletal symptoms, stiffness, swelling, muscle weakness and myalgia.  Dermatologic: Denied dermatology symptoms, rash and scar.  Neurologic: Denied neurology symptoms, dizziness, headache, neck pain and syncope.  Psychiatric: Denied psychiatric symptoms, anxiety and depression.  Endocrine: Denied endocrine symptoms including hot flashes and night sweats.   Meds:   Current Outpatient Medications on File Prior to Visit  Medication Sig Dispense Refill  . acetaminophen (TYLENOL) 500 MG tablet Take 500 mg by mouth every 6 (six) hours as needed.    Marland Kitchen. ibuprofen (ADVIL,MOTRIN) 200 MG tablet Take 200 mg by mouth every 6 (six) hours as needed.     No current facility-administered medications on file prior to visit.     Objective:     Vitals:   05/15/19 1021  BP: 95/60  Pulse: 94              Physical examination   Pelvic:   Vulva:  Extensive whitening of the labia and evidence of excoriation.  Some loss of architecture also present.  Vagina: No lesions or abnormalities noted.  Support: Normal pelvic support.  Urethra No masses tenderness or scarring.  Meatus Normal size without lesions or prolapse.  Cervix: Normal appearance.  No lesions.  Anus:  Anterior anal fissure noted  Perineum: Normal exam.  No  lesions.        Bimanual   Uterus: Normal size.  Non-tender.  Mobile.  AV.  Adnexae: No masses.  Non-tender to palpation.  Cul-de-sac: Negative for abnormality.   WET PREP: clue cells: absent, KOH (yeast): negative, odor: absent and trichomoniasis: negative Ph:  < 4.5   Assessment:    G2P0 There are no active problems to display for this patient.    1. Lichen  sclerosus   2. Monilial vulvitis   3. Anal fissure        Plan:            1.  Fiber laxatives and increase fluid intake to keep stools soft and attempt to allow anal fissure to heal  2.  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  Begin clobetasol use twice daily to prevent itching and to help to heal involved areas.  4.  We have discussed weight loss strategies in detail including weight watchers calorie counting etc.  5.  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 Orders No orders of the defined types were placed in this encounter.   No orders of the defined types were placed in this encounter.     F/U  Return in about 4 weeks (around 06/12/2019). I spent 34 minutes involved in the care of this patient of which greater than 50% was spent discussing above issues.  All questions answered.  Mary Mcbride, M.D. 05/15/2019 11:02 AM

## 2019-05-15 NOTE — Progress Notes (Signed)
Patient comes in today for new patient. She is having vaginal discharge and itching.

## 2019-05-15 NOTE — Addendum Note (Signed)
Addended by: Finis Bud on: 05/15/2019 11:10 AM   Modules accepted: Orders

## 2019-06-19 ENCOUNTER — Encounter: Payer: Self-pay | Admitting: Obstetrics and Gynecology

## 2019-06-29 ENCOUNTER — Encounter: Payer: Self-pay | Admitting: Obstetrics and Gynecology
